# Patient Record
Sex: Male | Born: 1970 | Race: White | Hispanic: No | Marital: Married | Smoking: Former smoker
Health system: Southern US, Community
[De-identification: ages and names within clinical notes are randomized; demographics above are authoritative.]

## PROBLEM LIST (undated history)

## (undated) DIAGNOSIS — J302 Other seasonal allergic rhinitis: Secondary | ICD-10-CM

## (undated) DIAGNOSIS — K219 Gastro-esophageal reflux disease without esophagitis: Secondary | ICD-10-CM

## (undated) HISTORY — PX: HAND TENDON SURGERY: SHX663

---

## 2008-07-03 ENCOUNTER — Ambulatory Visit: Payer: Self-pay | Admitting: Family Medicine

## 2008-07-03 DIAGNOSIS — S20229A Contusion of unspecified back wall of thorax, initial encounter: Secondary | ICD-10-CM | POA: Insufficient documentation

## 2008-07-03 DIAGNOSIS — Q762 Congenital spondylolisthesis: Secondary | ICD-10-CM

## 2009-01-28 ENCOUNTER — Ambulatory Visit: Payer: Self-pay | Admitting: Occupational Medicine

## 2009-01-28 DIAGNOSIS — S20219A Contusion of unspecified front wall of thorax, initial encounter: Secondary | ICD-10-CM | POA: Insufficient documentation

## 2009-02-12 ENCOUNTER — Ambulatory Visit: Payer: Self-pay | Admitting: Family Medicine

## 2009-02-26 ENCOUNTER — Ambulatory Visit: Payer: Self-pay | Admitting: Family Medicine

## 2009-02-26 DIAGNOSIS — M546 Pain in thoracic spine: Secondary | ICD-10-CM

## 2009-03-04 ENCOUNTER — Encounter: Admission: RE | Admit: 2009-03-04 | Discharge: 2009-03-14 | Payer: Self-pay | Admitting: Family Medicine

## 2009-03-12 ENCOUNTER — Ambulatory Visit: Payer: Self-pay | Admitting: Family Medicine

## 2009-03-18 ENCOUNTER — Encounter: Admission: RE | Admit: 2009-03-18 | Discharge: 2009-06-16 | Payer: Self-pay | Admitting: Family Medicine

## 2009-03-23 ENCOUNTER — Encounter: Payer: Self-pay | Admitting: Family Medicine

## 2009-03-29 ENCOUNTER — Ambulatory Visit: Payer: Self-pay | Admitting: Family Medicine

## 2009-03-29 DIAGNOSIS — S2249XA Multiple fractures of ribs, unspecified side, initial encounter for closed fracture: Secondary | ICD-10-CM

## 2009-03-30 ENCOUNTER — Encounter: Payer: Self-pay | Admitting: Family Medicine

## 2009-07-19 ENCOUNTER — Encounter: Payer: Self-pay | Admitting: Family Medicine

## 2010-01-13 ENCOUNTER — Ambulatory Visit: Payer: Self-pay | Admitting: Emergency Medicine

## 2010-01-13 DIAGNOSIS — M25519 Pain in unspecified shoulder: Secondary | ICD-10-CM

## 2010-01-16 ENCOUNTER — Encounter
Admission: RE | Admit: 2010-01-16 | Discharge: 2010-03-11 | Disposition: A | Discharge status: Rehabilitation Facility | Payer: Self-pay | Source: Home / Self Care | Attending: Sports Medicine | Admitting: Sports Medicine

## 2010-02-11 ENCOUNTER — Encounter: Admission: RE | Admit: 2010-02-11 | Discharge: 2010-02-11 | Payer: Self-pay | Admitting: Sports Medicine

## 2010-04-15 NOTE — Letter (Signed)
Summary: Work Paediatric nurse [facility]  [address]   [place], [place] [address]   Phone: [phone]  Fax: [phone]    Today's Date: January 13, 2010  Name of Patient: Steven Combs  The above named patient had a medical visit today at:  10 am  Please take this into consideration when reviewing the time away from work/school.    Special Instructions:  [  ] None  [  ] To be off the remainder of today, returning to the normal work / school schedule tomorrow.  [  ] To be off until the next scheduled appointment on ______________________.  [ X ] Other: No lifting >20 lbs until appointment with sports medicine on Nov 3rd @ 1pm.     Sincerely yours,   Hoyt Koch MD

## 2010-04-15 NOTE — Letter (Signed)
Summary: PHYSICAL THERAPY DISCHARGE SUMMARY  PHYSICAL THERAPY DISCHARGE SUMMARY   Imported By: Junius Finner 03/30/2009 11:35:37  _____________________________________________________________________  External Attachment:    Type:   Image     Comment:   External Document

## 2010-04-15 NOTE — Letter (Signed)
Summary: Out of Work  [facility]  [address]   [place], [place] [address]   Phone: [phone]  Fax: [phone]    March 29, 2009   Employee:  Reed Pandy    To Whom It May Concern:   Demontez Novack may return to work on on 04/01/2009 and resume his normal duties.    If you need additional information, please feel free to contact our office.         Sincerely,    Donna Christen MD

## 2010-04-15 NOTE — Letter (Signed)
Summary: FMLA FORMS  FMLA FORMS   Imported By: Junius Finner 03/29/2009 13:29:22  _____________________________________________________________________  External Attachment:    Type:   Image     Comment:   External Document

## 2010-04-15 NOTE — Assessment & Plan Note (Signed)
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  He hurt his R shoulder last week, but doesn't recall a specific injury.  Located on the top of his R shoulder, has some radiation distally to elbow.  Ice and NSAIDs and rest help.  He just refilled his Rx from last year.  He is most concerned because his job involves a lot of lifting and he doesn't want to cause any further problems.  He states that he has to be able to lift at work in order to do his job correctly.  No previous shoulder injuries, but he did break his ribs on that side last year, healed up very well with no residual pain.    Current Meds ADVIL 200 MG TABS (IBUPROFEN) 4prn  REVIEW OF SYSTEMS Constitutional Symptoms      Denies fever, chills, night sweats, weight loss, weight gain, and fatigue.  Eyes       Denies change in vision, eye pain, eye discharge, glasses, contact lenses, and eye surgery. Ear/Nose/Throat/Mouth       Denies hearing loss/aids, change in hearing, ear pain, ear discharge, dizziness, frequent runny nose, frequent nose bleeds, sinus problems, sore throat, hoarseness, and tooth pain or bleeding.  Respiratory       Denies dry cough, productive cough, wheezing, shortness of breath, asthma, bronchitis, and emphysema/COPD.  Cardiovascular       Denies murmurs,  chest pain, and tires easily with exhertion.    Gastrointestinal       Denies stomach pain, nausea/vomiting, diarrhea, constipation, blood in bowel movements, and indigestion. Genitourniary       Denies painful urination, kidney stones, and loss of urinary control. Neurological       Denies paralysis, seizures, and fainting/blackouts. Musculoskeletal       Complains of muscle pain, joint pain, joint stiffness, and decreased range of motion.      Denies redness, swelling, muscle weakness, and gout.  Skin       Denies bruising, unusual mles/lumps or sores, and hair/skin or nail changes.  Psych       Denies mood changes, temper/anger issues, anxiety/stress, speech problems, depression, and sleep problems.  Past History:  Past Medical History: Reviewed history from 07/03/2008 and no changes required. Unremarkable  Past Surgical History: Reviewed history from 07/03/2008 and no changes required. Flexor tendon repair R thumb  Family History: Reviewed history from 07/03/2008 and no changes required. Mother deceased CA Father diverticulosis Brother healthy Sister healthy  Social History: Reviewed history from 07/03/2008 and no changes required. 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  No swelling, no ecchymoses.  Mild TTP at [facility] and supraspinatus fossa.  No TTP acromion, coracoid, scapula, clavicle.  Normal sensation and pulses distally.  +Neers, pain with resisted scapular abduction, OBriens thumbs up and down.  Neg Neers, [place], Speeds, crossarm. Assessment New Problems: SHOULDER PAIN, RIGHT (ICD-719.41)  R shoulder strain, mild  Patient  Education: Patient and/or caregiver instructed in the following: rest.  Plan New Orders: Est. Patient Level IV [[address]] T-DG Shoulder*R* [730[address]] Planning Comments:   Continue the NSAIDs that you have at home.  They are either Tramadol or Diclofenac that were Rx last year by [place] for your broken ribs.   Ice your shoulder a few times a day.  Since your job includes a lot of lifting, I have written a note to limit lifting to 20 lbs this week. I have also set up an appt with sports medicine in 3 days to discuss your options.  Consider physical therapy if pain continues.   The patient and/or caregiver has been counseled thoroughly with regard to medications prescribed including dosage, schedule, interactions, rationale for use, and possible side effects and they verbalize understanding.  Diagnoses and expected course of recovery discussed and will return if not improved as expected or if the condition worsens. Patient and/or caregiver verbalized understanding.   Orders Added: 1)  Est. Patient Level IV [[address]] 2)  T-DG Shoulder*R* [73030]

## 2010-04-15 NOTE — Letter (Signed)
Summary: External Correspondence  External Correspondence   Imported By: Dannette Barbara 07/19/2009 10:41:26  _____________________________________________________________________  External Attachment:    Type:   Image     Comment:   External Document

## 2010-04-15 NOTE — Letter (Signed)
Summary: PHYSICAL THERAPY NOTE  PHYSICAL THERAPY NOTE   Imported By: Junius Finner 03/30/2009 11:36:37  _____________________________________________________________________  External Attachment:    Type:   Image     Comment:   External Document

## 2010-04-15 NOTE — Letter (Signed)
Summary: PHYSICAL THERAPY PROGRESS NOTE  PHYSICAL THERAPY PROGRESS NOTE   Imported By: Junius Finner 03/23/2009 16:08:59  _____________________________________________________________________  External Attachment:    Type:   Image     Comment:   External Document

## 2010-04-15 NOTE — Assessment & Plan Note (Signed)
Summary: FU APPT  CHIEF COMPLAINT:Rib Fracture  VITAL SIGNS:     Height:    (Previous:  72       )72"     Weight:   (Previous:197         )203     Temp:97.2     BP:135/89     Pulse:79     Resp:14     02 Sat:97%  ALLERGIES:  Past History, Family History, Social History previously recorded   Subjective:  Patient returns for follow-up of possible right rib fracture.  He reports that he still has discomfort in his right chest at times with physical activity but it has improved.  He is able to cut wood at home without difficulty.  His latest PT evaluation notes that he is able to lift 50 pounds floor to heart, and push/pull 75 pounds, but reports decreased tolerance to activity and decreased endurance.  Objective:  Appears comfortable and alerrt. Lungs:  Clear  Chest:  vague, non-localized mild tenderness right lateral chest to palpation Heart:  Regular rate and rhythm without murmurs, rubs, or gallops.  Abdomen:  Nontender without masses or hepatosplenomegaly.  Bowel sounds are present.  No CVA or flank tenderness.   Repeat chest X-ray:  Healing right 6th and 7th rib fractures with callus formation  Assessment:  CLOSED FRACTURE OF TWO RIBS (ICD-807.02) healing well.  Patient is now 2 months post injury.  Plan:   Resume work.  Continue rib belt as needed.  May take NSAID as needed.  Return for worsening .symptoms.

## 2011-02-22 ENCOUNTER — Encounter: Payer: Self-pay | Admitting: Emergency Medicine

## 2011-02-22 ENCOUNTER — Emergency Department
Admission: EM | Admit: 2011-02-22 | Discharge: 2011-02-22 | Disposition: A | Discharge status: Home / Self Care | Payer: Managed Care, Other (non HMO) | Source: Home / Self Care | Attending: Emergency Medicine | Admitting: Emergency Medicine

## 2011-02-22 DIAGNOSIS — J45909 Unspecified asthma, uncomplicated: Secondary | ICD-10-CM

## 2011-02-22 HISTORY — DX: Other seasonal allergic rhinitis: J30.2

## 2011-02-22 MED ORDER — PROMETHAZINE-CODEINE 6.25-10 MG/5ML PO SYRP: ORAL_SOLUTION | ORAL | Status: AC

## 2011-02-22 MED ORDER — PREDNISONE (PAK) 10 MG PO TABS: ORAL_TABLET | ORAL | Status: AC

## 2011-02-22 NOTE — ED Notes (Signed)
Cough and congestion x 3 weeks. No Flu vaccination this season.

## 2011-02-22 NOTE — ED Notes (Signed)
Taking Advil congestion OTC and Mucinex.

## 2011-02-22 NOTE — ED Provider Notes (Signed)
History     CSN: [id number] Arrival date & time: 02/22/2011  3:40 PM   First MD Initiated Contact with Patient 02/22/11 1602      Chief Complaint  Patient presents with  . Cough    (Consider location/radiation/quality/duration/timing/severity/associated sxs/prior treatment) HPI Comments: Started as URI 3 weeks ago with cough productive of slight yellow sputum with low-grade fever but those symptoms resolved after 5 days, except has persistent nonproductive cough. He gets this about once a year. Currently, no fever or chills.  Patient is a 40 y.o. male presenting with cough. The history is provided by the patient.  Cough This is a new problem. The current episode started more than 1 week ago (3 wks). The problem occurs every few minutes. The problem has not changed since onset.The cough is non-productive. There has been no fever. Associated symptoms include rhinorrhea (Minimal clear rhinorrhea). Pertinent negatives include no chest pain, no chills, no sweats, no weight loss, no ear congestion, no ear pain, no headaches, no sore throat, no myalgias, no shortness of breath, no wheezing and no eye redness. He has tried decongestants and cough syrup for the symptoms. The treatment provided no relief. He is a smoker. His past medical history is significant for bronchitis. His past medical history does not include pneumonia, bronchiectasis, COPD, emphysema or asthma.    Past Medical History  Diagnosis Date  . Seasonal allergic rhinitis     Past Surgical History  Procedure Date  . Hand tendon surgery     History reviewed. No pertinent family history.  History  Substance Use Topics  . Smoking status: Current Everyday Smoker  . Smokeless tobacco: Not on file  . Alcohol Use: Yes      Review of Systems  Constitutional: Negative for chills and weight loss.  HENT: Positive for rhinorrhea (Minimal clear rhinorrhea). Negative for ear pain and sore throat.   Eyes: Negative for redness.    Respiratory: Positive for cough. Negative for shortness of breath and wheezing.   Cardiovascular: Negative for chest pain.  Musculoskeletal: Negative for myalgias.  Neurological: Negative for headaches.  All other systems reviewed and are negative.    Allergies  Review of patient's allergies indicates no known allergies.  Home Medications   Current Outpatient Rx  Name Route Sig Dispense Refill  . PREDNISONE (PAK) 10 MG PO TABS  Take as directed for 6 days 21 tablet 0  . PROMETHAZINE-CODEINE 6.25-10 MG/5ML PO SYRP  Take 1-2 teaspoons every 4-6 hours as needed for cough. May cause drowsiness. 120 mL 0    Temp(Src) 98.2 F (36.8 C) (Oral)  Resp 16  Ht 5\' 11"  (1.803 m)  Wt 190 lb (86.183 kg)  BMI 26.50 kg/m2  SpO2 98% Hacking cough noted Physical Exam  Nursing note and vitals reviewed. Constitutional: He is oriented to person, place, and time. He appears well-developed and well-nourished. No distress.  HENT:  Head: Normocephalic and atraumatic.  Right Ear: Tympanic membrane normal.  Left Ear: Tympanic membrane normal.  Nose: Nose normal.  Mouth/Throat: Oropharynx is clear and moist. No oropharyngeal exudate.  Eyes: Right eye exhibits no discharge. Left eye exhibits no discharge. No scleral icterus.  Neck: Neck supple.  Cardiovascular: Normal rate, regular rhythm and normal heart sounds.   Pulmonary/Chest: No respiratory distress. He has wheezes (lungs are clear except for mild late expiratory wheezes only on forced expiration. Breath sounds equal.). He has no rhonchi. He has no rales.  Lymphadenopathy:    He has no cervical adenopathy.  Neurological: He is alert and oriented to person, place, and time.  Skin: Skin is warm and dry.    ED Course  Procedures (including critical care time)  Labs Reviewed - No data to display No results found.   1. Bronchitis, allergic       MDM  We discussed options at length. He declined a chest x-ray. I urged him to quit  smoking. His cough is likely allergic/inflammatory after a viral URI. Clinically, antibiotic is not indicated. He agrees. After risks benefits alternatives discussed, treat with prednisone, 6 day Dosepak. Phenergan with codeine prescribed with precautions discussed. I made it very clear to him that if his cough persisted for more than a week, to return here to reevaluate and do chest x-ray, or followup with his PCP. I urged him to quit smoking and explained and risks of not doing so. He voiced understanding and agreement with treatment plan.        Lonell Face, MD 02/22/11 [phone]

## 2011-09-21 ENCOUNTER — Emergency Department (INDEPENDENT_AMBULATORY_CARE_PROVIDER_SITE_OTHER)
Admission: EM | Admit: 2011-09-21 | Discharge: 2011-09-21 | Disposition: A | Discharge status: Home / Self Care | Payer: BC Managed Care – PPO | Source: Home / Self Care | Attending: Emergency Medicine | Admitting: Emergency Medicine

## 2011-09-21 ENCOUNTER — Encounter: Payer: Self-pay | Admitting: *Deleted

## 2011-09-21 DIAGNOSIS — T63441A Toxic effect of venom of bees, accidental (unintentional), initial encounter: Secondary | ICD-10-CM

## 2011-09-21 DIAGNOSIS — T6391XA Toxic effect of contact with unspecified venomous animal, accidental (unintentional), initial encounter: Secondary | ICD-10-CM

## 2011-09-21 MED ORDER — PREDNISONE (PAK) 10 MG PO TABS: ORAL_TABLET | ORAL | Status: AC

## 2011-09-21 NOTE — ED Provider Notes (Signed)
History     CSN: [id number]  Arrival date & time 09/21/11  1137   First MD Initiated Contact with Patient 09/21/11 1150      Chief Complaint  Patient presents with  . Insect Bite     Patient is a 41 y.o. male presenting with allergic reaction. The history is provided by the patient.  Allergic Reaction The primary symptoms are  rash and urticaria. The primary symptoms do not include shortness of breath, dizziness or palpitations. The current episode started yesterday. The problem has been gradually worsening. This is a new problem.  The rash is associated with itching.  The onset of the reaction was associated with insect bite/sting (Bee sting left anterior ankle). Significant symptoms also include itching. Significant symptoms that are not present include eye redness, flushing or rhinorrhea.   this started yesterday right after the bee sting left anterior ankle. Has tried elevating left ankle and cool compresses and that's only helping minimally. By mouth Benadryl at bedtime has helped a little . He denies history of anaphylaxis to any insect bites. The swelling of the left anterior ankle with itching and soreness has progressed the past 24 hours. Denies focal paresthesias or weakness.  Past Medical History  Diagnosis Date  . Seasonal allergic rhinitis     Past Surgical History  Procedure Date  . Hand tendon surgery     Family History  Problem Relation Age of Onset  . Cancer Mother     multiple myloma  . Cancer Father     esophogeal/ brain    History  Substance Use Topics  . Smoking status: Current Everyday Smoker -- 0.5 packs/day  . Smokeless tobacco: Not on file  . Alcohol Use: Yes      Review of Systems  Constitutional: Negative for fever and chills.  HENT: Negative for sore throat, facial swelling, rhinorrhea, trouble swallowing, neck pain and voice change.   Eyes: Negative.  Negative for redness.  Respiratory: Negative.  Negative for chest tightness and  shortness of breath.   Cardiovascular: Negative.  Negative for chest pain and palpitations.  Gastrointestinal: Negative.   Genitourinary: Negative.   Musculoskeletal: Negative for back pain and joint swelling.  Skin: Positive for itching and rash. Negative for flushing.  Neurological: Positive for numbness (Locally at site of bee sting.). Negative for dizziness and light-headedness.  Hematological: Negative.   Psychiatric/Behavioral: Negative.  Negative for hallucinations and confusion.    Allergies  Review of patient's allergies indicates no known allergies.  Home Medications   Current Outpatient Rx  Name Route Sig Dispense Refill  . BIOTIN 10 MG PO CAPS Oral Take by mouth.    Marland Kitchen PREDNISONE (PAK) 10 MG PO TABS  Take as directed for 6 days. 21 tablet 0    BP 132/85  Pulse 74  Temp 97.8 F (36.6 C) (Oral)  Resp 16  Ht 5\' 11"  (1.803 m)  Wt 200 lb 8 oz (90.946 kg)  BMI 27.96 kg/m2  SpO2 97%  Physical Exam  Constitutional: He is oriented to person, place, and time. He appears well-developed and well-nourished. No distress.  HENT:  Head: Normocephalic and atraumatic.  Mouth/Throat: Oropharynx is clear and moist.  Eyes: Conjunctivae and EOM are normal. Pupils are equal, round, and reactive to light.  Neck: Normal range of motion. Neck supple. No JVD present. No tracheal deviation present.  Cardiovascular: Normal rate and regular rhythm.   Pulmonary/Chest: Effort normal and breath sounds normal.  Abdominal: He exhibits no distension.  Musculoskeletal:  Normal range of motion.  Neurological: He is alert and oriented to person, place, and time.  Skin: Rash (Left anterior ankle) noted.  Psychiatric: He has a normal mood and affect.   Skin: 5 x 5 cm area of blanching erythema, induration, minimally tender, mildly warm, left anterior ankle. No fluctuance or drainage.     ED Course  Procedures (including critical care time)  Labs Reviewed - No data to display No results  found.   1. Local reaction to bee sting       MDM  Significant local reaction left anterior ankle, secondary to bee sting. No evidence of anaphylaxis. We discussed treatment options, and patient prefers by mouth prednisone. He declined a shot of Depo-Medrol. Also treat with Zyrtec twice a day by mouth. Cool compresses and keep left leg elevated. Recheck if not improving in 2 days, sooner when necessary. Otherwise follow up with PCP. See detailed Instructions in AVS, which were given to patient. Verbal instructions also given. Risks, benefits, and alternatives of treatment options discussed. Questions invited and answered. Patient voiced understanding and agreement with plans.        Lajean Manes, MD 09/21/11 [phone]

## 2011-09-21 NOTE — ED Notes (Signed)
Pt c/o bee sting to his LT ankle x 1 day ago. He has taken benadryl, applied ice and bite cream with no relief.

## 2011-09-24 ENCOUNTER — Telehealth: Payer: Self-pay

## 2011-09-24 NOTE — ED Notes (Signed)
Left a message on voice mail asking how patient is feeling and advising to call back with any questions or concerns.  

## 2011-11-17 ENCOUNTER — Emergency Department (INDEPENDENT_AMBULATORY_CARE_PROVIDER_SITE_OTHER)
Admission: EM | Admit: 2011-11-17 | Discharge: 2011-11-17 | Disposition: A | Discharge status: Home / Self Care | Payer: BC Managed Care – PPO | Source: Home / Self Care | Attending: Family Medicine | Admitting: Family Medicine

## 2011-11-17 ENCOUNTER — Encounter: Payer: Self-pay | Admitting: *Deleted

## 2011-11-17 DIAGNOSIS — R197 Diarrhea, unspecified: Secondary | ICD-10-CM

## 2011-11-17 HISTORY — DX: Gastro-esophageal reflux disease without esophagitis: K21.9

## 2011-11-17 LAB — POCT CBC W AUTO DIFF (K'VILLE URGENT CARE)

## 2011-11-17 NOTE — ED Provider Notes (Signed)
History     CSN: [id number]  Arrival date & time 11/17/11  1109   First MD Initiated Contact with Patient 11/17/11 1208      Chief Complaint  Patient presents with  . Diarrhea      HPI Comments: Patient complains of onset 3 days ago of abdominal bloating and watery diarrhea.  No abdominal pain.  He has had mild improvement after taking Imodium, but symptoms worsened when he ate a small amount of food.  No nausea/vomiting.  No fatigue.  No fevers, chills, and sweats.  No myalgias.  No blood in stool.  No GU symptoms.  Denies recent foreign travel, or drinking untreated water in a wilderness environment.                                                                                                                                                                                                                                                                                                                                                                                                                                                                                           Patient is a 41 y.o. male  presenting with diarrhea. The history is provided by the patient.  Diarrhea The primary symptoms include abdominal pain and diarrhea. Primary symptoms do not include fever, weight loss, fatigue, nausea, vomiting, melena, hematemesis, jaundice, hematochezia, dysuria, myalgias, arthralgias or rash. Episode onset: 3 days ago. The onset was sudden. The problem has not changed since onset. The illness is also significant for bloating and tenesmus. The illness does not include chills, anorexia, dysphagia, odynophagia, constipation, back pain or itching.    Past Medical History  Diagnosis Date  . Seasonal allergic rhinitis   . GERD (gastroesophageal reflux disease)     Past Surgical History  Procedure Date  . Hand tendon surgery     Family History  Problem Relation Age of Onset  . Cancer Mother    multiple myloma  . Cancer Father     esophogeal/ brain    History  Substance Use Topics  . Smoking status: Former Smoker -- 0.0 packs/day  . Smokeless tobacco: Not on file   Comment: pt is using Vapes  . Alcohol Use: Yes      Review of Systems  Constitutional: Negative for fever, chills, weight loss and fatigue.  Gastrointestinal: Positive for abdominal pain, diarrhea and bloating. Negative for dysphagia, nausea, vomiting, constipation, melena, hematochezia, anorexia, hematemesis and jaundice.  Genitourinary: Negative for dysuria.  Musculoskeletal: Negative for myalgias, back pain and arthralgias.  Skin: Negative for itching and rash.  All other systems reviewed and are negative.    Allergies  Review of patient's allergies indicates no known allergies.  Home Medications   Current Outpatient Rx  Name Route Sig Dispense Refill  . OMEPRAZOLE 20 MG PO CPDR Oral Take 20 mg by mouth daily.    Marland Kitchen BIOTIN 10 MG PO CAPS Oral Take by mouth.      BP 135/89  Pulse 68  Temp 97.9 F (36.6 C) (Oral)  Resp 18  Ht 5\' 11"  (1.803 m)  Wt 197 lb (89.359 kg)  BMI 27.48 kg/m2  SpO2 99%  Physical Exam Nursing notes and Vital Signs reviewed. Appearance:  Patient appears healthy, stated age, and in no acute distress Eyes:  Pupils are equal, round, and reactive to light and accomodation.  Extraocular movement is intact.  Conjunctivae are not inflamed  Ears:  Canals normal.  Tympanic membranes normal.  Nose:  Mildly congested turbinates.  No sinus tenderness.  Mouth:  moist mucous membranes  Pharynx:  Normal Neck:  Supple.  No adenopathy Lungs:  Clear to auscultation.  Breath sounds are equal.  Heart:  Regular rate and rhythm without murmurs, rubs, or gallops.  Abdomen:  Mild tenderness in peri-umbilical area without masses or hepatosplenomegaly.  No rebound tenderness.  Bowel sounds are present.  No CVA or flank tenderness.  Extremities:  No edema.  No calf tenderness Skin:  No rash  present.   ED Course  Procedures  none   Labs Reviewed  POCT CBC W AUTO DIFF ([facility])   WBC 6.1; LY 30.1; MO 8.3; GR 61.6; Hgb 13.8; Platelets 229       1. Diarrhea; suspect viral syndrome       MDM  Continue clear liquids for about 18 to 24 hours (Use Pedialyte while diarrhea is present), then switch to a [employer].  Gradually advance to a regular diet as tolerated.  Avoid milk products until well.  Return for worsening symptoms.  May use Imodium when stools are less watery.       Lattie Haw, MD 11/18/11  1335 

## 2011-11-17 NOTE — ED Notes (Signed)
Pt c/o diarrhea x 3 days. Denies fever. Denies blood in stool. He has taken imodium with some relief.

## 2011-12-29 ENCOUNTER — Encounter: Payer: Self-pay | Admitting: *Deleted

## 2011-12-29 ENCOUNTER — Emergency Department (INDEPENDENT_AMBULATORY_CARE_PROVIDER_SITE_OTHER): Payer: BC Managed Care – PPO

## 2011-12-29 ENCOUNTER — Ambulatory Visit (INDEPENDENT_AMBULATORY_CARE_PROVIDER_SITE_OTHER): Payer: BC Managed Care – PPO | Admitting: Sports Medicine

## 2011-12-29 ENCOUNTER — Emergency Department (INDEPENDENT_AMBULATORY_CARE_PROVIDER_SITE_OTHER)
Admission: EM | Admit: 2011-12-29 | Discharge: 2011-12-29 | Disposition: A | Discharge status: Home / Self Care | Payer: BC Managed Care – PPO | Source: Home / Self Care

## 2011-12-29 DIAGNOSIS — M5416 Radiculopathy, lumbar region: Secondary | ICD-10-CM | POA: Insufficient documentation

## 2011-12-29 DIAGNOSIS — M25552 Pain in left hip: Secondary | ICD-10-CM

## 2011-12-29 DIAGNOSIS — M25569 Pain in unspecified knee: Secondary | ICD-10-CM

## 2011-12-29 DIAGNOSIS — M25559 Pain in unspecified hip: Secondary | ICD-10-CM

## 2011-12-29 DIAGNOSIS — IMO0002 Reserved for concepts with insufficient information to code with codable children: Secondary | ICD-10-CM

## 2011-12-29 DIAGNOSIS — M5417 Radiculopathy, lumbosacral region: Secondary | ICD-10-CM

## 2011-12-29 MED ORDER — MELOXICAM 15 MG PO TABS: ORAL_TABLET | ORAL | Status: AC

## 2011-12-29 MED ORDER — PREDNISONE 50 MG PO TABS: ORAL_TABLET | ORAL | Status: AC

## 2011-12-29 MED ORDER — CYCLOBENZAPRINE HCL 10 MG PO TABS: ORAL_TABLET | ORAL | Status: AC

## 2011-12-29 NOTE — ED Provider Notes (Signed)
History     CSN: [id number]  Arrival date & time 12/29/11  0901   First MD Initiated Contact with Patient 12/29/11 [phone]      Chief Complaint  Patient presents with  . Hip Pain  . Knee Pain   HPI L hip and knee pain x 1 week.  Pt states that he has had sensation of hip pain that has radiated to L anterior knee.  No prior hx/o trauma, though pt does report standing on feet at work all day as well as doing a lot of yard work.  No prior hx/o back pain/trauma.  Pt states that he does go to the chiropractor regularly.  Pt states that this has not been helping with symptoms.  Pt states that his knee gave way while at work.  No distal numbness or paresthesias.  Has had some leg burning radiating from posterior hip to anterior knee on L side.     Past Medical History  Diagnosis Date  . Seasonal allergic rhinitis   . GERD (gastroesophageal reflux disease)     Past Surgical History  Procedure Date  . Hand tendon surgery     Family History  Problem Relation Age of Onset  . Cancer Mother     multiple myloma  . Cancer Father     esophogeal/ brain    History  Substance Use Topics  . Smoking status: Former Smoker -- 0.0 packs/day  . Smokeless tobacco: Not on file   Comment: pt is using Vapes  . Alcohol Use: Yes      Review of Systems  All other systems reviewed and are negative.    Allergies  Review of patient's allergies indicates no known allergies.  Home Medications   Current Outpatient Rx  Name Route Sig Dispense Refill  . BIOTIN 10 MG PO CAPS Oral Take by mouth.    . OMEPRAZOLE 20 MG PO CPDR Oral Take 20 mg by mouth daily.      BP 166/105  Pulse 65  Temp 97.5 F (36.4 C) (Oral)  Resp 18  Ht 5\' 10"  (1.778 m)  Wt 202 lb (91.627 kg)  BMI 28.98 kg/m2  SpO2 98%  Physical Exam  Vitals reviewed. Constitutional: He appears well-developed and well-nourished.  HENT:  Head: Normocephalic and atraumatic.  Eyes: Conjunctivae normal are normal. Pupils are  equal, round, and reactive to light.  Neck: Normal range of motion. Neck supple.  Cardiovascular: Normal rate and regular rhythm.   Pulmonary/Chest: Effort normal and breath sounds normal.  Abdominal: Soft.  Musculoskeletal:       + TTP along L posterior hip. Full ROM. Mild pain with external rotation of hip. Full flexion and extension.   Knee full ROM, mild anterior pain with knee flexion and extension. No swelling or deformity.      ED Course  Procedures (including critical care time)  Labs Reviewed - No data to display No results found.   1. Hip pain, left   2. Knee pain   3. Lumbosacral radiculopathy at L4       MDM  Suspect lumbar radiculopathy around L4 given radiation to anterior knee with contribution of questionable pyriformis strain.  Sports medicine consulted.  Will defer management and follow up to sports medicine.          Doree Albee, MD 12/29/11 1043

## 2011-12-29 NOTE — ED Notes (Signed)
Pt c/o LT knee and hip pain x 1wk, denies injury. He has taken Aleve.

## 2011-12-29 NOTE — Progress Notes (Signed)
Subjective:    I'm seeing this patient as a consultation for:  Dr. Alvester Morin  CC: Back pain and leg pain  HPI: Steven Combs is a pleasant 41 year old male who has about a week and a half history of pain that he localizes along his mid lower lumbar spine, radiating down his left leg.  He doesn't know the symptoms any worse with heavy lifting, forward flexion, extension, or Valsalva. The pain does travel down the anterior aspect of his left thigh, to his knee. He denies any symptoms radiating past the knee. He also denies any recent trauma. He's never had any form of formal intervention for this.  Past medical history, Surgical history, Family history, Social history, Allergies, and medications have been entered into the medical record, reviewed, and no changes needed.   Review of Systems: No headache, visual changes, nausea, vomiting, diarrhea, constipation, dizziness, abdominal pain, skin rash, fevers, chills, night sweats, weight loss, swollen lymph nodes, body aches, joint swelling, muscle aches, chest pain, or shortness of breath.   Objective:   Vitals:  Afebrile, vital signs stable. General: Well Developed, well nourished, and in no acute distress.  Neuro/Psych: Alert and oriented x3, extra-ocular muscles intact, able to move all 4 extremities.  Skin: Warm and dry, no rashes noted.  Respiratory: Not using accessory muscles, speaking in full sentences, trachea midline.  Cardiovascular: Pulses palpable, no extremity edema. Abdomen: Does not appear distended. Back Exam:  Inspection: Unremarkable  Motion: Flexion 45 deg, Extension 45 deg, Side Bending to 45 deg bilaterally,  Rotation to 45 deg bilaterally  SLR laying: Straight leg raise on the left side is positive with paresthesias, and numbness on the plantar aspect of the left foot.  XSLR laying: Negative  Palpable tenderness: None. FABER: negative. Sensory change: Gross sensation intact to all lumbar and sacral dermatomes.  Reflexes: Reflexes  are 2+ throughout with the exception of the left and right Achilles, which are 1+. Babinski's are downgoing bilaterally. Strength at foot  Plantar-flexion: 5/5 Dorsi-flexion: 5/5 Eversion: 5/5 Inversion: 5/5  Leg strength  Quad: 5/5 Hamstring: 5/5 Hip flexor: 5/5 Hip abductors: 5/5  Gait unremarkable.  I ordered, and personally reviewed x-rays of his lumbar spine, they show grade 1 spondylolisthesis at the L5-S1 level with what appear to be pars defect bilaterally at the L5 level.  There's also some disc space narrowing at the L5-S1 level.    Impression and Recommendations:   This case required medical decision making of moderate complexity.

## 2011-12-29 NOTE — Assessment & Plan Note (Addendum)
Goals here will be strengthening, and stabilization of his bilateral pars defects with mild spondylolisthesis. Left sided. Prednisone, Flexeril at bedtime. Home rehabilitation exercises. Out of work for the rest of the day. He will come back to see me in 4 weeks, MRI if no better.

## 2011-12-30 ENCOUNTER — Encounter: Payer: Self-pay | Admitting: *Deleted

## 2012-01-01 ENCOUNTER — Encounter: Payer: Self-pay | Admitting: Sports Medicine

## 2012-01-01 DIAGNOSIS — Z0289 Encounter for other administrative examinations: Secondary | ICD-10-CM

## 2012-01-01 NOTE — Progress Notes (Signed)
Patient ID: Steven Combs, male   DOB: [birth year], 41 y.o.   MRN: [id number]   FMLA Forms filled out.

## 2012-01-25 ENCOUNTER — Encounter: Payer: Self-pay | Admitting: Sports Medicine

## 2012-01-25 ENCOUNTER — Ambulatory Visit (INDEPENDENT_AMBULATORY_CARE_PROVIDER_SITE_OTHER): Payer: BC Managed Care – PPO | Admitting: Sports Medicine

## 2012-01-25 VITALS — BP 139/87 | HR 105 | Temp 97.8°F | Resp 18 | Ht 70.0 in | Wt 202.0 lb

## 2012-01-25 DIAGNOSIS — IMO0002 Reserved for concepts with insufficient information to code with codable children: Secondary | ICD-10-CM

## 2012-01-25 DIAGNOSIS — M5416 Radiculopathy, lumbar region: Secondary | ICD-10-CM

## 2012-01-25 NOTE — Assessment & Plan Note (Signed)
Radicular symptoms resolve. Axial back pain 90% better. Continue home rehabilitation. I filled out a release for him to continue working. He can come back to see me on an as-needed basis.

## 2012-01-25 NOTE — Progress Notes (Signed)
SPORTS MEDICINE CONSULTATION REPORT  Subjective:    CC: Lumbar radiculitis  HPI: A treated Steven Combs with steroids, muscle relaxers, anti-inflammatories, home therapy, and chiropractic care. He did have a grade 1 spondylolisthesis at the L5-S1 level. Overall he is at least 90% better, or radicular symptoms have resolved, and axial back symptoms are only intermittent. He has been out of work, and is eager to get back into it.  Past medical history, Surgical history, Family history, Social history, Allergies, and medications have been entered into the medical record, reviewed, and no changes needed.   Review of Systems: No headache, visual changes, nausea, vomiting, diarrhea, constipation, dizziness, abdominal pain, skin rash, fevers, chills, night sweats, weight loss, swollen lymph nodes, body aches, joint swelling, muscle aches, chest pain, or shortness of breath.   Objective:   Vitals:  Afebrile, vital signs stable. General: Well Developed, well nourished, and in no acute distress.  Neuro/Psych: Alert and oriented x3, extra-ocular muscles intact, able to move all 4 extremities.  Skin: Warm and dry, no rashes noted.  Respiratory: Not using accessory muscles, speaking in full sentences, trachea midline.  Cardiovascular: Pulses palpable, no extremity edema. Abdomen: Does not appear distended. Back Exam:  Inspection: Unremarkable  Motion: Flexion 45 deg, Extension 45 deg, Side Bending to 45 deg bilaterally,  Rotation to 45 deg bilaterally  SLR laying: Negative  XSLR laying: Negative  Palpable tenderness: None. FABER: negative. Sensory change: Gross sensation intact to all lumbar and sacral dermatomes.  Reflexes: 2+ at both patellar tendons, 2+ at achilles tendons, Babinski's downgoing.  Strength at foot  Plantar-flexion: 5/5 Dorsi-flexion: 5/5 Eversion: 5/5 Inversion: 5/5  Leg strength  Quad: 5/5 Hamstring: 5/5 Hip flexor: 5/5 Hip abductors: 5/5  Gait unremarkable.   Impression and  Recommendations:   This case required medical decision making of moderate complexity.

## 2013-05-04 ENCOUNTER — Encounter: Payer: Self-pay | Admitting: Emergency Medicine

## 2013-05-04 ENCOUNTER — Emergency Department
Admission: EM | Admit: 2013-05-04 | Discharge: 2013-05-04 | Disposition: A | Discharge status: Home / Self Care | Payer: Managed Care, Other (non HMO) | Source: Home / Self Care | Attending: Emergency Medicine | Admitting: Emergency Medicine

## 2013-05-04 ENCOUNTER — Emergency Department (INDEPENDENT_AMBULATORY_CARE_PROVIDER_SITE_OTHER): Payer: Managed Care, Other (non HMO)

## 2013-05-04 DIAGNOSIS — M25429 Effusion, unspecified elbow: Secondary | ICD-10-CM

## 2013-05-04 DIAGNOSIS — IMO0002 Reserved for concepts with insufficient information to code with codable children: Secondary | ICD-10-CM

## 2013-05-04 DIAGNOSIS — M7020 Olecranon bursitis, unspecified elbow: Secondary | ICD-10-CM

## 2013-05-04 DIAGNOSIS — L03119 Cellulitis of unspecified part of limb: Secondary | ICD-10-CM

## 2013-05-04 DIAGNOSIS — M7022 Olecranon bursitis, left elbow: Secondary | ICD-10-CM

## 2013-05-04 DIAGNOSIS — M25529 Pain in unspecified elbow: Secondary | ICD-10-CM

## 2013-05-04 LAB — POCT CBC W AUTO DIFF (K'VILLE URGENT CARE)

## 2013-05-04 LAB — URIC ACID: Uric Acid, Serum: 5.7 mg/dL (ref 4.0–7.8)

## 2013-05-04 MED ORDER — DOXYCYCLINE HYCLATE 100 MG PO CAPS: 100.0000 mg | ORAL_CAPSULE | Freq: Two times a day (BID) | ORAL | Status: AC

## 2013-05-04 MED ORDER — MELOXICAM 7.5 MG PO TABS: ORAL_TABLET | ORAL | Status: AC

## 2013-05-04 NOTE — ED Notes (Signed)
Left elbow cellulitis, banged it on a piece of steel 4 days ago, red and swollen painful

## 2013-05-04 NOTE — ED Provider Notes (Signed)
CSN: [id number]     Arrival date & time 05/04/13  [id number] History   First MD Initiated Contact with Patient 05/04/13 1005     Chief Complaint  Patient presents with  . Cellulitis      HPI "Left elbow cellulitis, banged it on a piece of steel 3 days ago," red and swollen painful, progressively worsening Moderate sharp pain and swelling posterior left elbow, exacerbated by touching it or pressure.  No radiation No history of joint problems or gout Denies fever or chills. He denies having an open wound Past Medical History  Diagnosis Date  . Seasonal allergic rhinitis   . GERD (gastroesophageal reflux disease)    Past Surgical History  Procedure Laterality Date  . Hand tendon surgery     Family History  Problem Relation Age of Onset  . Cancer Mother     multiple myloma  . Cancer Father     esophogeal/ brain   History  Substance Use Topics  . Smoking status: Former Smoker -- 0.50 packs/day for 30 years    Types: Cigarettes    Quit date: 04/27/2011  . Smokeless tobacco: Never Used     Comment: pt is using Vapes  . Alcohol Use: Yes    Review of Systems  All other systems reviewed and are negative.      Allergies  Review of patient's allergies indicates not on file.  Home Medications   Current Outpatient Rx  Name  Route  Sig  Dispense  Refill  . lisinopril-hydrochlorothiazide (PRINZIDE,ZESTORETIC) 20-12.5 MG per tablet   Oral   Take 1 tablet by mouth daily.         . cyclobenzaprine (FLEXERIL) 10 MG tablet      One half tab PO qHS, then increase gradually to one tab TID.   30 tablet   0   . doxycycline (VIBRAMYCIN) 100 MG capsule   Oral   Take 1 capsule (100 mg total) by mouth 2 (two) times daily.   20 capsule   0   . meloxicam (MOBIC) 15 MG tablet      One tab PO qAM with breakfast for 2 weeks, then daily prn pain.   30 tablet   3   . meloxicam (MOBIC) 7.5 MG tablet      Take 1 twice a day as needed for pain. Take with food. (Do not take with  any other NSAID.)   20 tablet   0   . omeprazole (PRILOSEC) 20 MG capsule   Oral   Take 20 mg by mouth daily.         . predniSONE (DELTASONE) 50 MG tablet      One tab PO daily for 5 days.   5 tablet   0    BP 125/79  Pulse 73  Temp(Src) 98 F (36.7 C) (Oral)  Ht 5\' 11"  (1.803 m)  Wt 199 lb (90.266 kg)  BMI 27.77 kg/m2  SpO2 100% Physical Exam  Nursing note and vitals reviewed. Constitutional: He is oriented to person, place, and time. He appears well-developed and well-nourished. No distress.  HENT:  Head: Normocephalic and atraumatic.  Eyes: Conjunctivae and EOM are normal. Pupils are equal, round, and reactive to light. No scleral icterus.  Neck: Normal range of motion.  Cardiovascular: Normal rate.   Pulmonary/Chest: Effort normal.  Abdominal: He exhibits no distension.  Musculoskeletal:       Left elbow: He exhibits decreased range of motion and swelling. He exhibits no effusion. Tenderness found.  Olecranon process tenderness noted. No radial head, no medial epicondyle and no lateral epicondyle tenderness noted.       Arms: Swelling, tenderness, induration of soft tissues posterior elbow, especially tender over olecranon process. The area is red, warm, tender to touch  Neurological: He is alert and oriented to person, place, and time.  Skin: Skin is warm.  Psychiatric: He has a normal mood and affect.    ED Course  Procedures (including critical care time) Labs Review Labs Reviewed  URIC ACID   Narrative:    Performed at:  [facility]                [address], [address]                [place], [place] [address]  POCT CBC W AUTO DIFF ([facility])   Imaging Review Dg Elbow Complete Left  05/04/2013   CLINICAL DATA:  Pain, swelling, and redness post trauma  EXAM: LEFT ELBOW - COMPLETE 3+ VIEW  COMPARISON:  None.  FINDINGS: Frontal, lateral, and bilateral oblique views were obtained. There is no fracture, dislocation, or effusion. Joint  spaces appear intact. No erosive change or bony destruction. No soft tissue abscess is apparent. There is, however, questionable induration in the soft tissues primarily medial to the elbow joint.  IMPRESSION: Question a degree of cellulitis medially. No frank soft tissue abscess seen on this study. No bony abnormality. No joint effusion.   Electronically Signed   By: Bretta Bang M.D.   On: 05/04/2013 11:26      MDM   Final diagnoses:  Cellulitis of elbow  Olecranon bursitis of left elbow   reviewed x-ray left elbow: No fracture dislocation or effusion. No soft tissue abscess. There is questionable induration in the soft tissues, consistent with cellulitis. CBC today: WBC 11.3 with a left shift and 72% granulocytes. Hemoglobin normal 14.3 Clinically, he likely has cellulitis of soft tissues of elbow without evidence of involvement of bone or joint. Also in the differential is olecranon bursitis from trauma 3 days ago. Also in the differential is gout.  Treatment options discussed, as well as risks, benefits, alternatives. Patient voiced understanding and agreement with the following plans: Blood uric acid ordered. ACE left elbow. Sling. For antibiotic coverage, he declined injection of Rocephin. Doxycycline prescribed. Mobic as needed for pain and inflammation. Followup with Dr. Thurston Hole, his orthopedist, if no better in one week, sooner if worse or new symptoms  Precautions discussed. Red flags discussed. Questions invited and answered. Patient voiced understanding and agreement.       Lajean Manes, MD 05/04/13 2135

## 2013-05-08 ENCOUNTER — Telehealth: Payer: Self-pay | Admitting: *Deleted

## 2015-12-17 IMAGING — CR DG ELBOW COMPLETE 3+V*L*
4 series · 4 of 4 positions shown · non-contrast
Comparison: None.

CLINICAL DATA: Pain, swelling, and redness post trauma

EXAM:
LEFT ELBOW - COMPLETE 3+ VIEW

[view not recorded (1 of 4)]
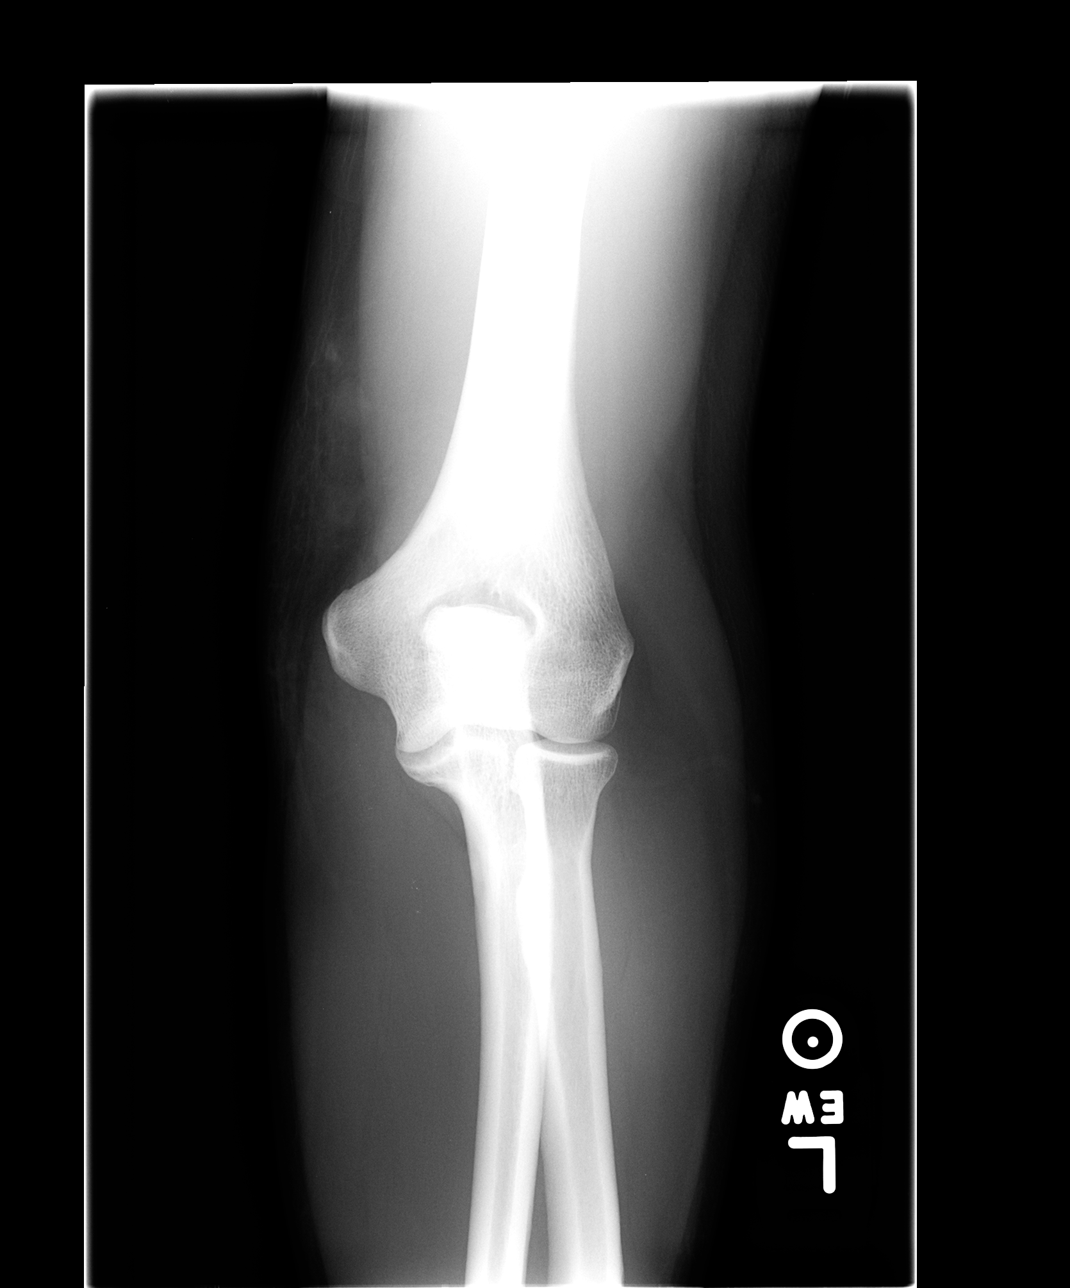

[view not recorded (2 of 4)]
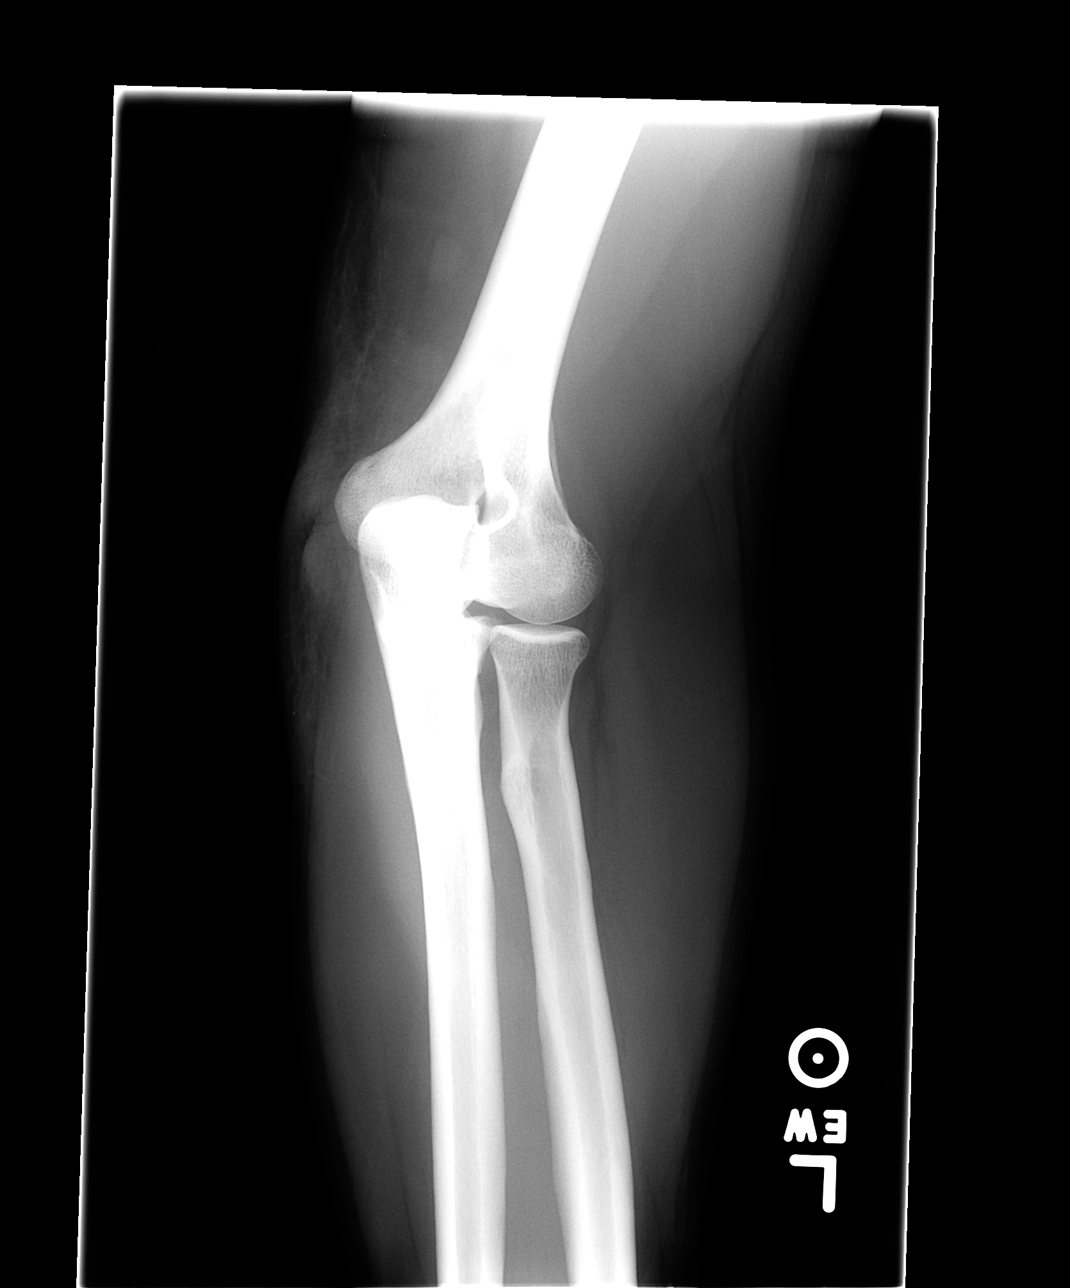

[view not recorded (3 of 4)]
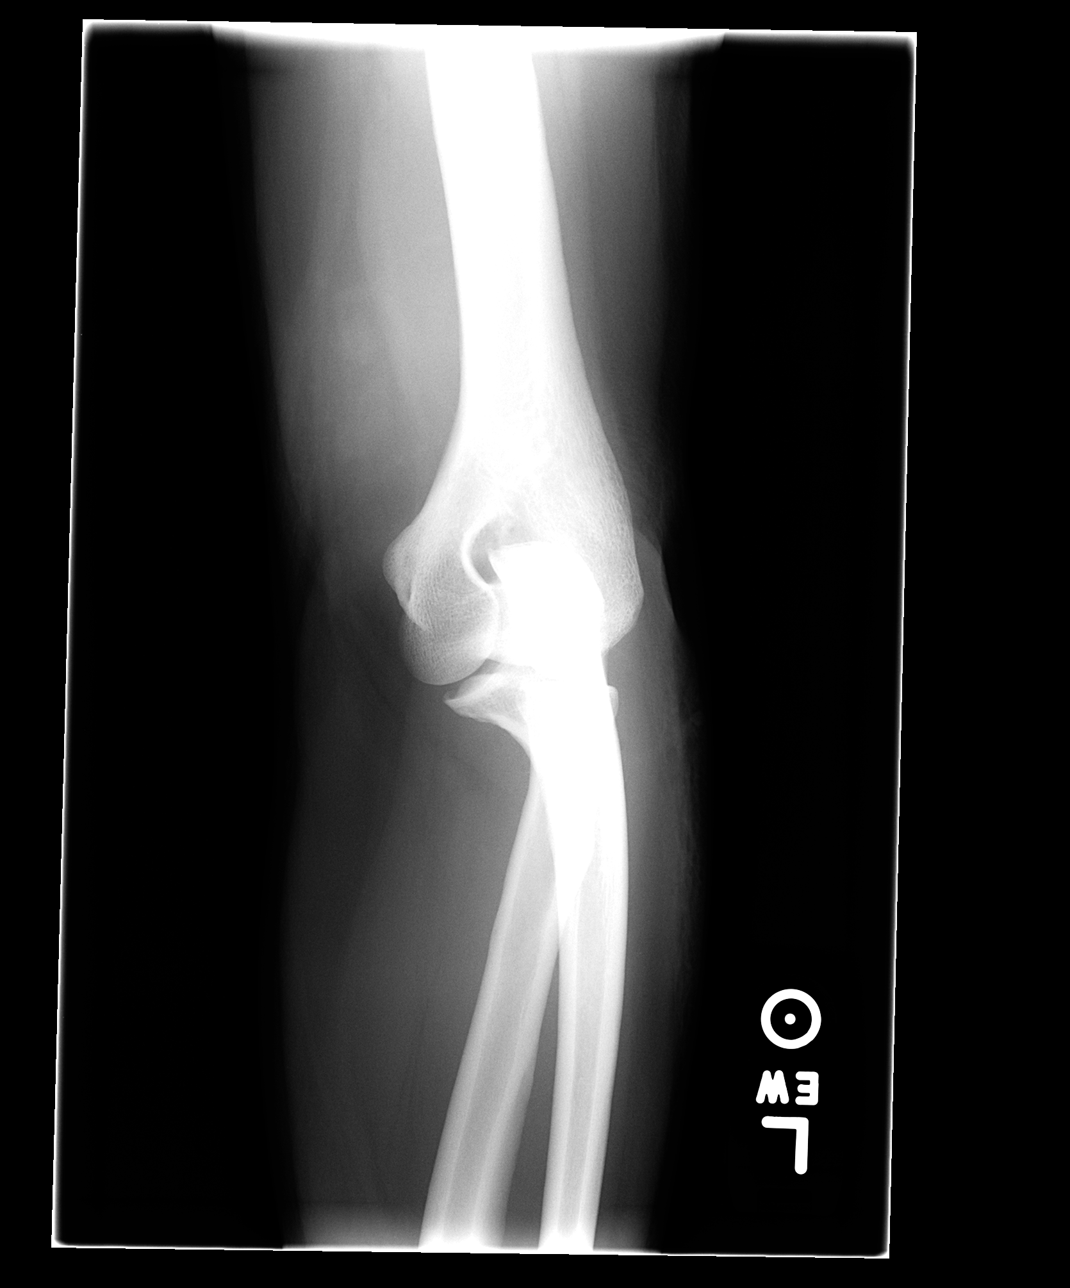

[view not recorded (4 of 4)]
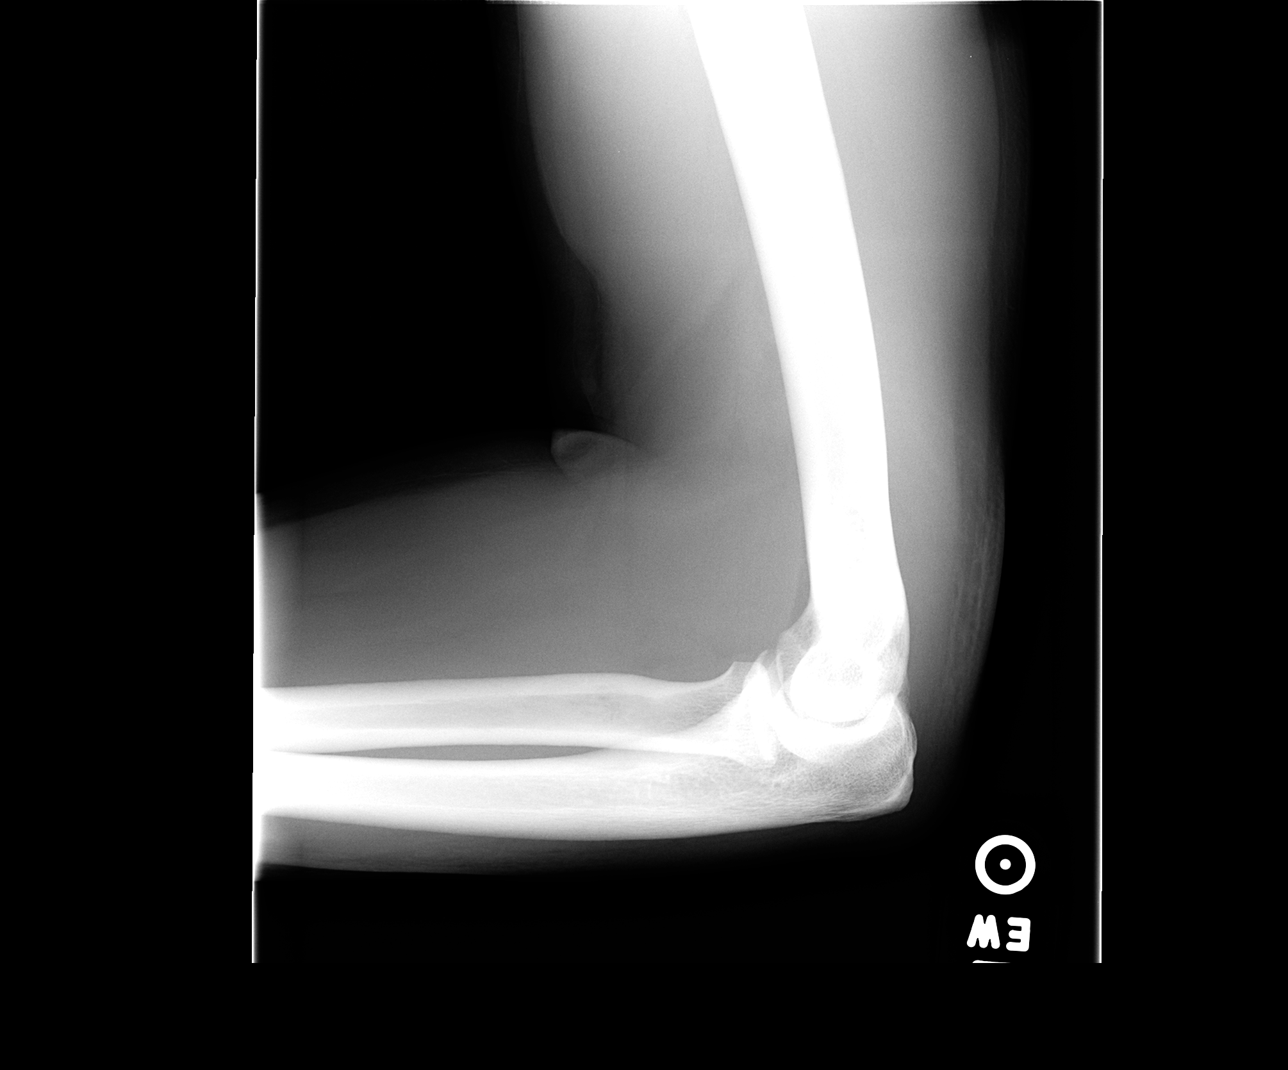

[4 of 4 positions shown; findings below may reference images not displayed]

FINDINGS: Frontal, lateral, and bilateral oblique views were obtained. There
is no fracture, dislocation, or effusion. Joint spaces appear
intact. No erosive change or bony destruction. No soft tissue
abscess is apparent. There is, however, questionable induration in
the soft tissues primarily medial to the elbow joint.
IMPRESSION: Question a degree of cellulitis medially. No frank soft tissue
abscess seen on this study. No bony abnormality. No joint effusion.
